# Patient Record
Sex: Female | Born: 2000 | Race: White | Hispanic: No | Marital: Married | Smoking: Never smoker
Health system: Southern US, Community
[De-identification: ages and names within clinical notes are randomized; demographics above are authoritative.]

---
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
## 2023-02-19 IMAGING — DX DG ANKLE COMPLETE 3+V*R*
4 series · 4 of 4 positions shown · non-contrast
Comparison: None.

CLINICAL DATA: Rolled ankle this morning

EXAM:
RIGHT ANKLE - COMPLETE 3+ VIEW

[ankle ap]
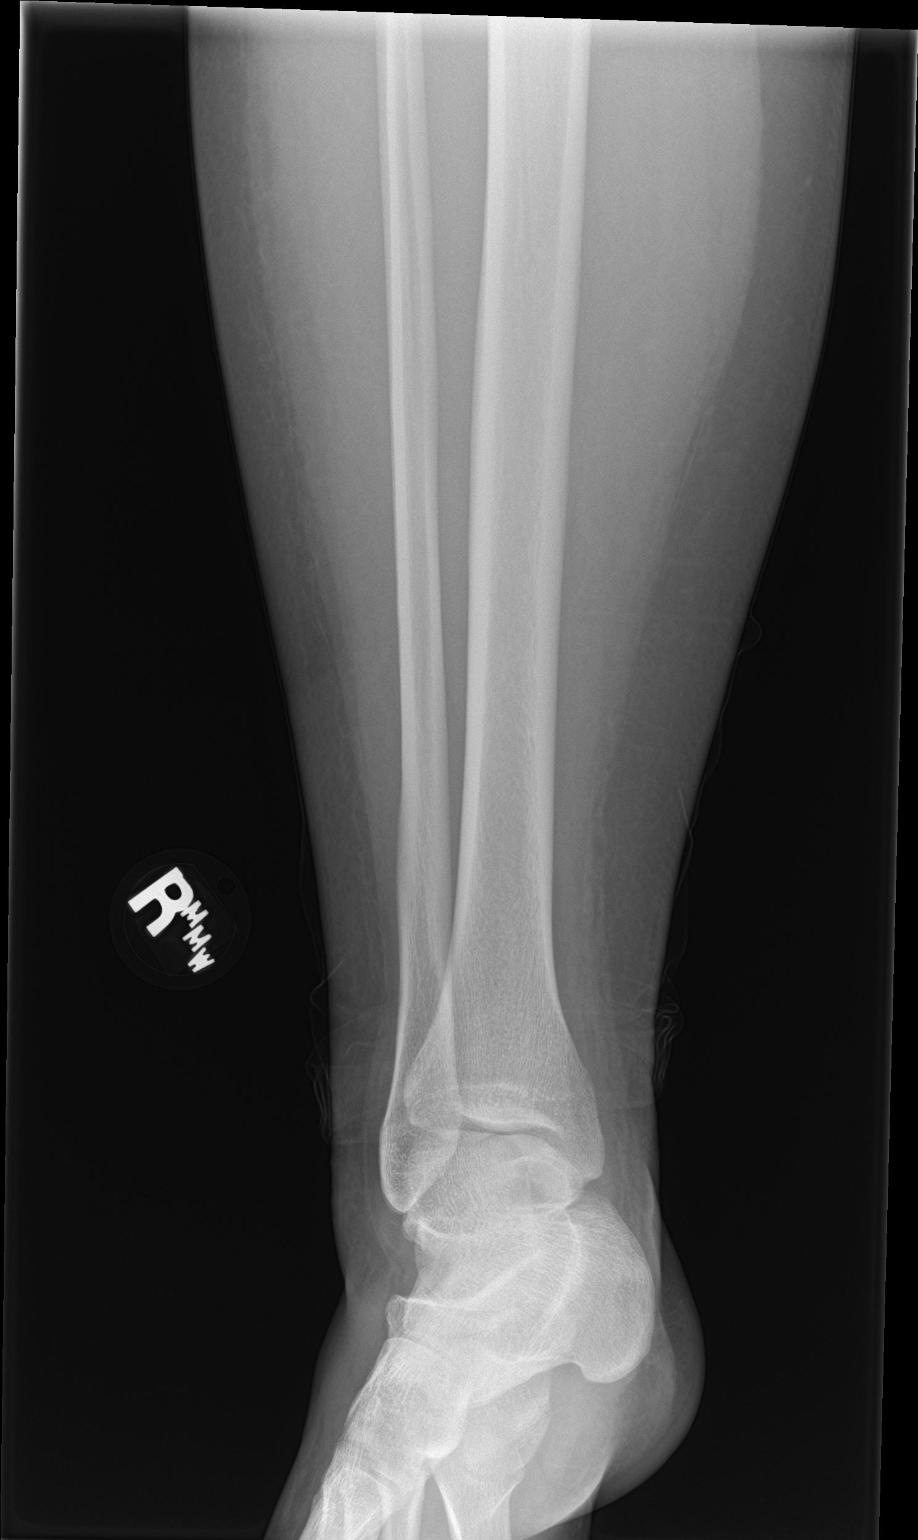

[ankle obl (1 of 2)]
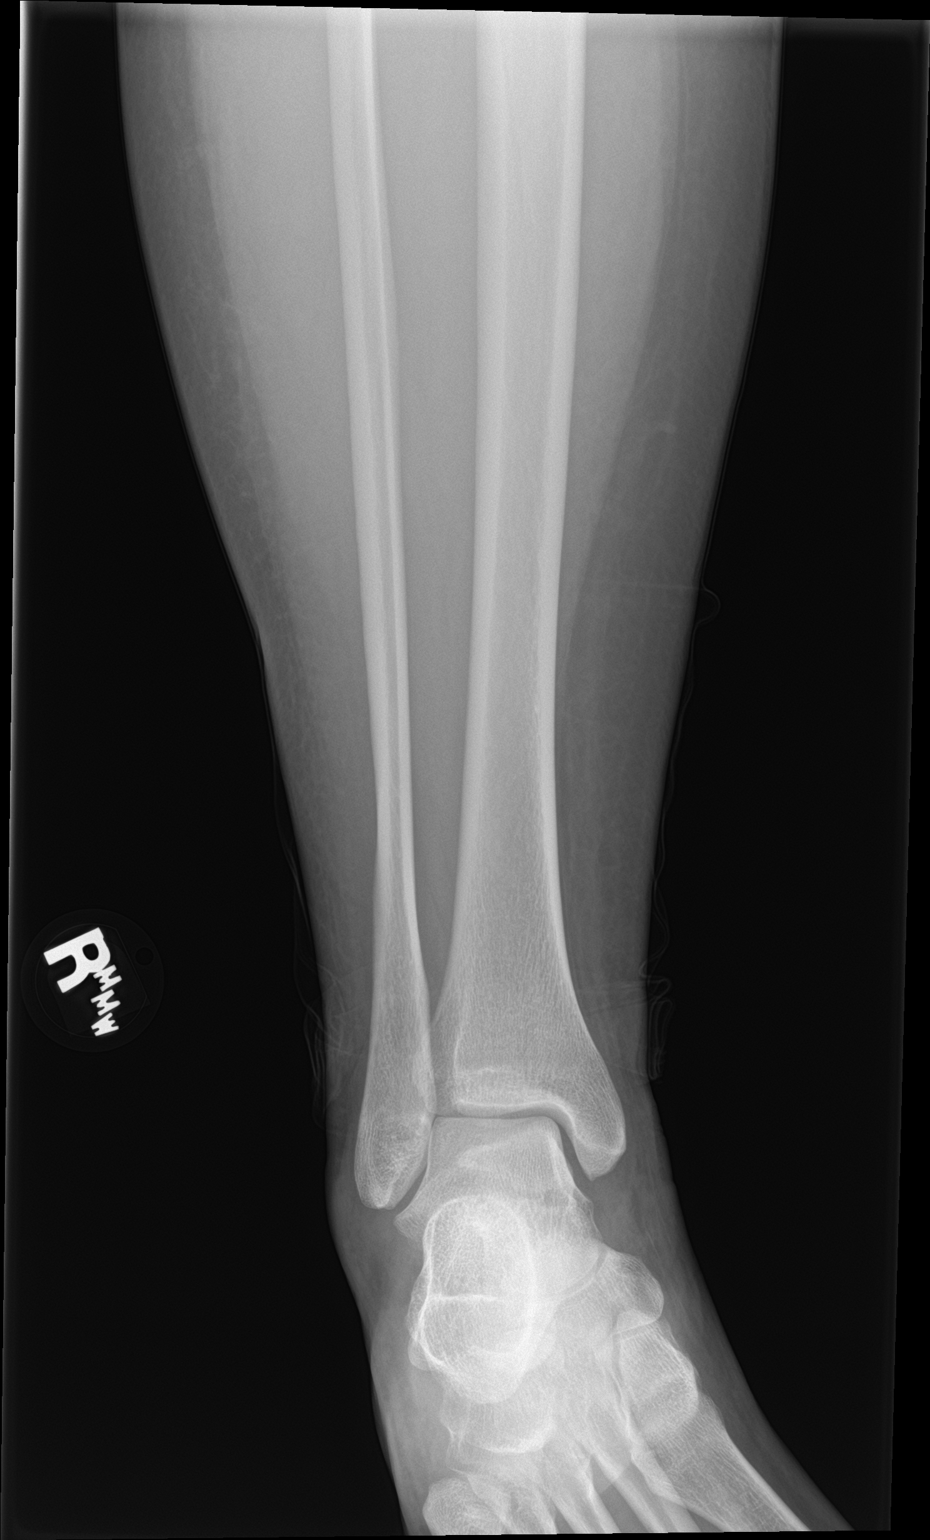

[ankle obl (2 of 2)]
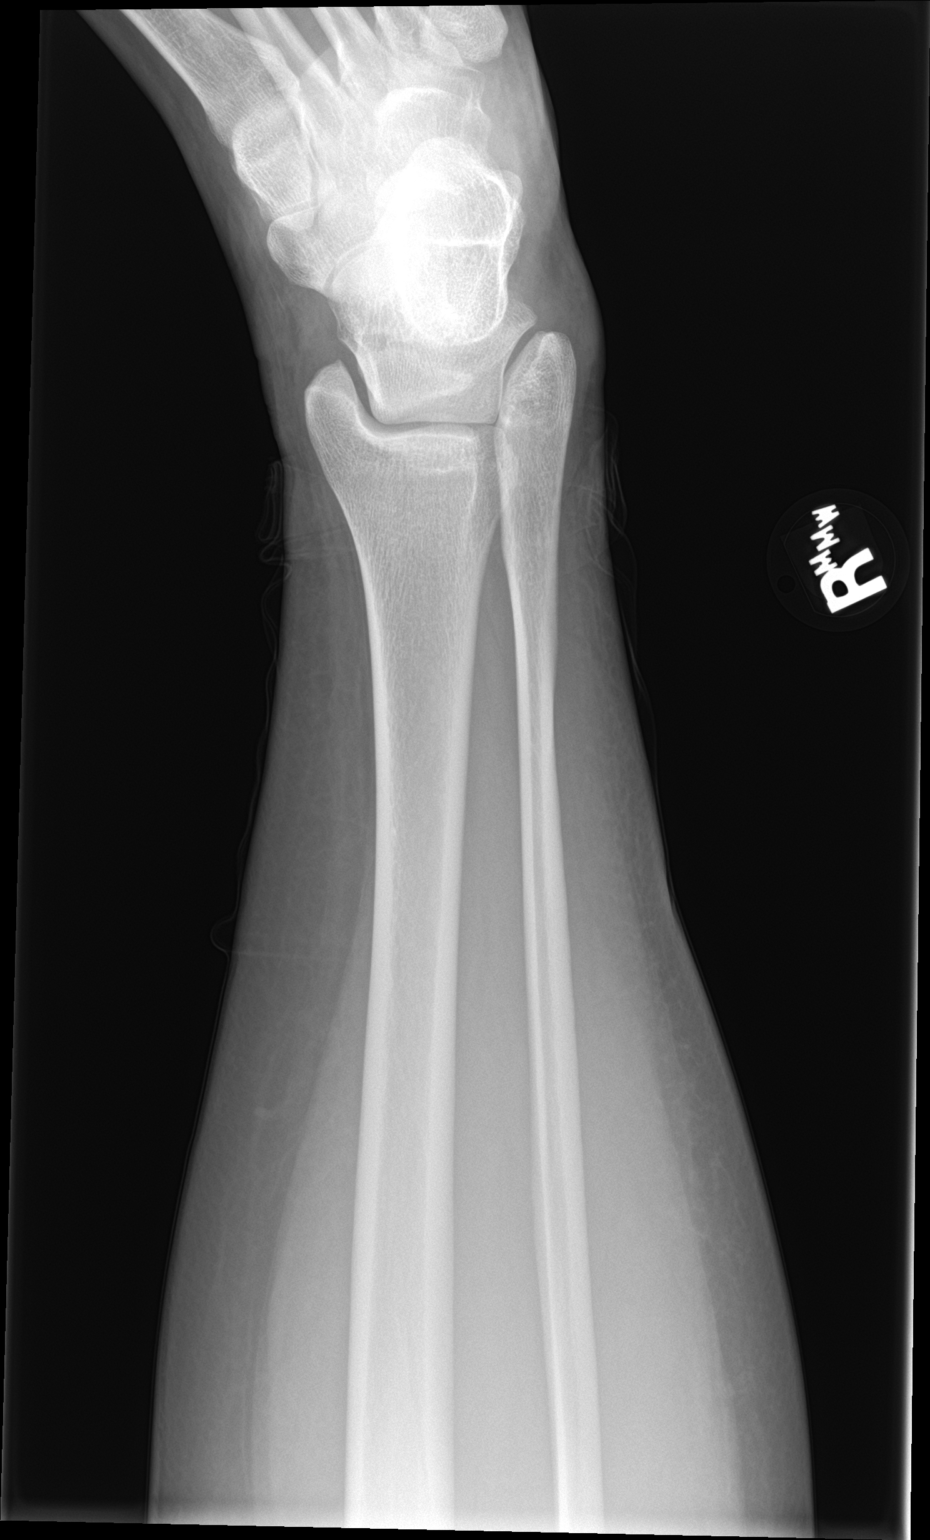

[ankle lat]
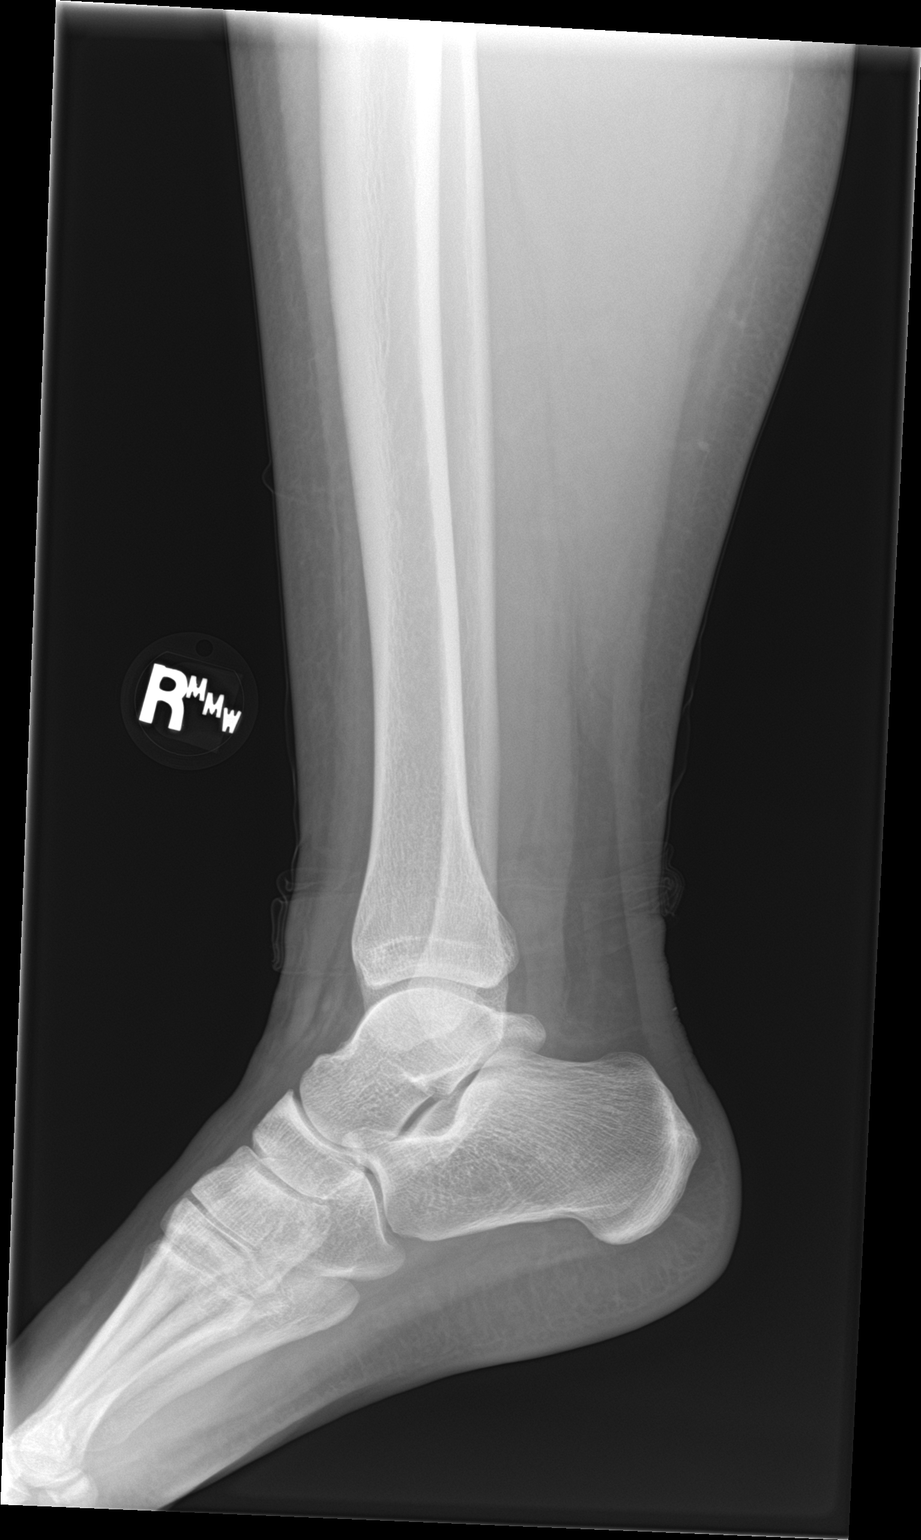

[4 of 4 positions shown; findings below may reference images not displayed]

FINDINGS: There is no acute fracture or dislocation. Alignment is normal. The
ankle mortise is intact. Joint spaces are preserved. There is no
erosive change. There is mild soft tissue swelling over the lateral
malleolus.
IMPRESSION: Mild soft tissue swelling over the lateral malleolus without
underlying fracture or dislocation.
